# Patient Record
Sex: Male | Born: 1960 | Race: White | Hispanic: No | Marital: Married
Health system: Northeastern US, Academic
[De-identification: ages and names within clinical notes are randomized; demographics above are authoritative.]

---

## 2016-08-17 ENCOUNTER — Emergency Department
Admit: 2016-08-17 | Disposition: A | Discharge status: Home / Self Care | Source: Home / Self Care | Attending: Emergency Medicine | Admitting: Emergency Medicine

## 2016-09-22 ENCOUNTER — Ambulatory Visit (HOSPITAL_BASED_OUTPATIENT_CLINIC_OR_DEPARTMENT_OTHER)

## 2016-09-22 ENCOUNTER — Ambulatory Visit

## 2016-09-22 NOTE — Progress Notes (Addendum)
---    **Progress Notes**    ---    **Patient:** Kenneth Powers   **Provider:** Laverda Page, MD     **DOB:** Feb 05, 1961 **Age:** 55 Y **Sex:** Male   **Date:** 09/22/2016             * * *        ---         **Reason for Appointment**    ---       1\. NP-establish care    ---    2\. Declined flu vaccine    ---       **History of Present Illness**    ---     _Depression Screening_ :    PHQ-9 Little interest or pleasure in doing things Not at all, Feeling down,  depressed, or hopeless Not at all, Trouble falling or staying asleep, or  sleeping too much Not at all, Feeling tired or having little energy Not at  all, Poor appetite or overeating Not at all, Feeling bad about yourself-or  that you are a failure or have let yourself or your family down Not at all,  Trouble concentrating on things, such as reading the newspaper or watching  television Not at all, Moving or speaking so slowly that other people could  have noticed. Or the opposite- being so fidgety or restless that you have been  moving around a lot more than usual Not at all, Thoughts that you would be  better off dead, or of hurting yourself in some way? Not at all, Total Score:  0\.    _*Updated MSSP_ :    Influenza Vaccination (08/01-03/31 current year) Documented No, Patient  Refused. BMI Weight management counseling provided Yes, Recommendations:  Caloric intake discussed. Depression Screening (All Ages) PHQ9 Done: Score <9  (less than or equal to 9).    _General_ :    No complaints. Last PCP >10 yrs ago.       **Current Medications**    ---       None    ---       **Past Medical History**    ---       No Medical History.    ---       **Surgical History**    ---       Denies Past Surgical History    ---       **Family History**    ---       Father: deceased, , family history unknown Unknown    ---    Mother: alive, , family history unknown Unknown    ---    3 brother(s) , 1 sister(s) - healthy. 2 son(s) , 1 daughter(s) - healthy.    ---    - No FHx  MI, CVA, DM, mental illness, substance abuse.    - Father with liver CA at 68YO in no alcohol use & no hepatitis. No other FHx  cancer, including no prostate, testicular or colon CA.    - 4 full siblings.    ---       **Social History**    ---    Tobacco Use Status: current smoker, Patient counseled on cessation:  09/22/2016.   Lives with wife, 8YO dter, 14YO dter, 19YO son and a puppy. Has  3 kids. Feels safe at home.    Tobacco - 4 cigs a day. Began [age]YO. Most 1 ppd x 2-3 yrs.    Alcohol - 2 days on weekends, 2-3 beers.  No drug/marijuana use.    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    ---       **Allergies**    ---       N.K.D.A.    ---       **Hospitalization/Major Diagnostic Procedure**    ---       Denies Past Hospitalization    ---       **Review of Systems**    ---    See scanned intake form.       **Vital Signs**    ---    BP 108/68, HR 68, Ht 71, Wt 121.2, BMI 16.90, RR 16, Oxygen sat % 98.       **Examination**    ---     _General Examination_ :    GENERAL APPEARANCE: Thin, adult male, alert and oriented, no acute distress.Marland Kitchen  HEENT: Normocephalic, atraumatic, B/L Extra Ocular Movements Intact (EOMI),  nonicteric, no scleral erythema, Pupils Equal, Round & Reactive to Light  (PERRL), external ears normal, tympanic membranes normal, nose with normal  external appearance, no maxillary/mastoid or frontal sinus tenderness,  oropharynx/tonsils normal with no erythema, edema or exudate.Marland Kitchen NECK: Supple  without lymphadenopathy. Thyroid - no enlargement, nodules or tenderness.Marland Kitchen  HEART: Regular rate and rhythm, normal S1 & S2, with no murmurs, rubs or  gallops.. LUNGS: Clear to auscultation bilaterally with no wheezes, rhales or  rhonchi.. ABDOMEN: Soft, non-tender, non-distended, BS present.Marland Kitchen EXTREMITIES:  B/L lower extremity with no edema.Marland Kitchen NEUROLOGIC EXAM: Grossly non-focal..  PSYCHOLOGICAL: Affect with no depression, anxiety or agitation..          **Assessments**    ---    1\. Encounter for routine  history and physical examination of adult - V70.0  (Primary)    ---    2\. Osteoporosis - M81.0    ---    3\. Influenza vaccine refused - Z28.21    ---    4\. Encounter for immunization - Z23    ---      \[facility] - Last tetanus > 10 yrs ago; TDaP today. Declines flu vaccine.  Declines COLO; hemosure today. Check total cholesterol & glucose.    - Weight - Most 130-135 lbs over lifetime; 134 when married. Weighs 121 lbs  presently. Not loosing wt rapidly; no recent 10 lbs wt loss. Has always been  very thin; Father and a brother are similar. Discuss wt gain at upcoming  visit.    - Tobacco abuse - 4 cigs daily. see SHx. Trying to quit. Discuss tobacco  cessation and evalutate for low dose CT at upcoming visit.    - Osteoporosis - Does walk regularly. Smokes. Very thin. Caucasian  [place]. Check bone density.    - F/U 2 mos.    ---       **Treatment**    ---       **1\. Encounter for routine history and physical examination of adult**    _LAB: GLUCOSE RANDOM - BS_    _LAB: CHOLESTEROL SERUM - CHOL_    ---        **2\. Osteoporosis**    _Imaging: BD Bone Density DEXA Axial Skeleton e-sch*_         **3\. Others**    Notes: Patient Educated with: Smoking.pdf (Smoking.pdf).      **Immunization**    ---       TDAP - OFFICE SUPPLIED : 0.5 mL given by Sharlot Gowda on Left Arm    ---       **  Procedure Codes**    ---       16109 TDAP - OFFICE SUPPLIED    ---    [phone] Single or Combination    ---       **Follow Up**    ---    2 Months    Electronically signed by Bonne Dolores , M.D. on 09/22/2016 at 10:55 AM EST    Sign off status: Completed          * * *      Patient: Kenneth Powers   Provider: Laverda Page, MD    --- ---    DOB: May 21, 1961  Date: 09/22/2016    Note generated by eClinicalWorks EMR/PM Software ([email or web address])    ---

## 2016-09-25 ENCOUNTER — Ambulatory Visit

## 2017-11-15 ENCOUNTER — Emergency Department
Admit: 2017-11-15 | Disposition: A | Discharge status: Home / Self Care | Source: Home / Self Care | Attending: Family | Admitting: Family

## 2018-05-12 ENCOUNTER — Emergency Department
Admit: 2018-05-12 | Disposition: A | Discharge status: Home / Self Care | Source: Home / Self Care | Attending: Pediatrics | Admitting: Pediatrics

## 2018-05-12 LAB — HX URINE DIPSTICK W/REFLEX
CASE NUMBER: 2019200002360
HX UA BILIRUBIN: NEGATIVE — NL
HX UA GLUCOSE: NEGATIVE — NL
HX UA KETONES: 20 mg/dL — AB
HX UA LEUKOCYTE ESTERASE: NEGATIVE — NL
HX UA NITRITE: NEGATIVE — NL
HX UA PH: 5 — NL (ref 5.0–8.0)
HX UA PROTEIN: NEGATIVE — NL
HX UA RBC: 23 /HPF — ABNORMAL HIGH (ref 0.0–2.0)
HX UA SPECIFIC GRAVITY: 1.024 — NL (ref 1.003–1.03)
HX UA SQUAMOUS EPITHELIAL: <1 — NL (ref 0.0–5.0)
HX UA UROBILINOGEN: NEGATIVE — NL
HX UA WBC: 2 /HPF — NL (ref 0.0–5.0)

## 2018-05-25 ENCOUNTER — Ambulatory Visit: Admitting: Orthopaedic Surgery

## 2018-05-25 NOTE — Consults (Signed)
__________________________________________________________________________    CONSULTATION  DATE:  05/25/2018    REASON FOR CONSULTATION:  Back pain due to compression fracture.    HISTORY OF PRESENT ILLNESS:  The patient is a 57 year old gentleman who  sustained an injury on 05/12/2018 when he was working for a [employer]  when he was struck by  falling debris in an excavation hole.  He was partially  buried for a period of time.  After he was extracted, he complained of severe  back pain.  He was taken to the Emergency Department where L2 compression  fracture was identified.  He was found to have intact extremity functioning and  is here.  He was given pain medication and discharged and is here for an  outpatient consultation.  He reports no history of similar symptoms.  No back  pain and reported he did not note any loss of consciousness during the incident.   He reports over the last two weeks since the injury continued to have back pain  and difficulty ambulating due to his symptoms.  He did not report any bowel or  bladder symptoms.    PAST MEDICAL HISTORY:  Otherwise noncontributory.  He does have a history of  smoking.    PHYSICAL EXAMINATION:  GENERAL:  Demonstrates a well-developed, well-nourished  gentleman who is in back discomfort.  HEAD AND NECK:  Unremarkable.  EXTREMITIES:  Upper extremity exam shows  nontender range of motion.  BACK:  Clearly demonstrates diffuse tenderness  centered around the region of upper lumbar spine.  He was able to maintain  straight leg raise bilaterally distally.  Dorsi plantar flexion also intact.   Sensation grossly intact.    RADIOLOGY DATA:  X-ray and CT scan was reviewed.  It demonstrates acute L2  vertebral body burst fracture with approximately 50% loss of height and no  retropulsion into the canal space.  There is a nondisplaced L2 spinous process  fracture as well.  It does not appear to be renal injuries.    IMPRESSION:  L2 compression fracture and  spinous process fracture.    PLAN:  He will be fitted with a TLSO brace.  He also requires Physical Therapy.   He is not able to return to work at this time.  Will followup to assess his  progress in four weeks.    Dictated by:  Jenne Pane, M.D.    D:  05/26/2018 08:22:55  T:  05/27/2018 11:38:56  E:  05/27/2018 11:38:56  WW/tam  Job# [id number]   SIGNATURE LINE    Electronically signed by Dorna Bloom MD, [facility] on 05/29/2018 at 07:18:55 EST

## 2019-09-19 ENCOUNTER — Emergency Department
Admit: 2019-09-19 | Disposition: A | Discharge status: Home / Self Care | Source: Home / Self Care | Attending: Emergency Medicine | Admitting: Emergency Medicine

## 2019-09-19 LAB — HX BLUE TOP TO HOLD: CASE NUMBER: 2020330002054

## 2019-09-19 LAB — HX .AUTOMATED DIFF
CASE NUMBER: 2020330002054
HX ABSOLUTE BASO COUNT: 0.03 10*3/uL — NL (ref 0.0–0.22)
HX ABSOLUTE EOS COUNT: 0.11 10*3/uL — NL (ref 0.0–0.45)
HX ABSOLUTE LYMPHS COUNT: 1.75 10*3/uL — NL (ref 0.74–5.04)
HX ABSOLUTE MONO COUNT: 0.69 10*3/uL — NL (ref 0.0–1.34)
HX ABSOLUTE NEUTRO COUNT: 10.98 10*3/uL — ABNORMAL HIGH (ref 1.48–7.95)
HX BASOPHILS: 0.2 %
HX EOSINOPHILS: 0.8 %
HX IMMATURE GRANULOCYTES: 0.4 % — NL (ref 0.0–2.0)
HX LYMPHOCYTES: 12.9 %
HX MONOCYTES: 5.1 %
HX NEUTROPHILS: 80.6 %

## 2019-09-19 LAB — HX COMPREHENSIVE METABOLIC PANEL
CASE NUMBER: 2020330002054
HX ALBUMIN LVL: 4 g/dL — NL (ref 3.2–5.0)
HX ALKALINE PHOSPHATASE: 74 U/L — NL (ref 30.0–117.0)
HX ALT: 11 U/L — NL (ref 6.0–55.0)
HX ANION GAP: 6 — NL (ref 3.0–11.0)
HX AST: 11 U/L — NL (ref 6.0–40.0)
HX BILIRUBIN TOTAL: 0.3 mg/dL — NL (ref 0.2–1.2)
HX BUN: 21 mg/dL — ABNORMAL HIGH (ref 6.0–20.0)
HX CALCIUM LVL: 9 mg/dL — NL (ref 8.5–10.5)
HX CHLORIDE: 106 mmol/L — NL (ref 98.0–110.0)
HX CO2: 25 mmol/L — NL (ref 21.0–32.0)
HX CREATININE: 0.994 mg/dL — NL (ref 0.55–1.3)
HX GLUCOSE LVL: 96 mg/dL — NL (ref 70.0–110.0)
HX POTASSIUM LVL: 4.8 mmol/L — NL (ref 3.6–5.2)
HX SODIUM LVL: 137 mmol/L — NL (ref 136.0–146.0)
HX TOTAL PROTEIN: 7.6 g/dL — NL (ref 6.0–8.4)

## 2019-09-19 LAB — HX CBC W/ DIFF
CASE NUMBER: 2020330002054
HX ABSOLUTE NRBC COUNT: 0 10*3/uL
HX HCT: 38.8 % — ABNORMAL LOW (ref 39.0–53.0)
HX HGB: 13.6 g/dL — NL (ref 13.0–17.5)
HX MCH: 34.1 pg — ABNORMAL HIGH (ref 26.0–34.0)
HX MCHC: 35.1 g/dL — NL (ref 31.0–37.0)
HX MCV: 97.2 fL — NL (ref 80.0–100.0)
HX MPV: 10.1 fL — NL (ref 9.4–12.4)
HX NRBC PERCENT: 0 % — NL
HX PLATELET: 200 10*3/uL — NL (ref 150.0–400.0)
HX RBC: 3.99 10*6/uL — ABNORMAL LOW (ref 4.2–5.9)
HX RDW-CV: 11.8 % — NL (ref 11.5–14.5)
HX RDW-SD: 42.5 fL — NL (ref 35.0–51.0)
HX WBC: 13.6 10*3/uL — ABNORMAL HIGH (ref 4.0–11.0)

## 2019-09-19 LAB — HX GLOMERULAR FILTRATION RATE (ESTIMATED)
CASE NUMBER: 2020330002054
HX AFN AMER GLOMERULAR FILTRATION RATE: >90
HX NON-AFN AMER GLOMERULAR FILTRATION RATE: 84 mL/min/{1.73_m2}

## 2019-09-19 LAB — HX SST GOLD TUBE TO HOLD: CASE NUMBER: 2020330002148

## 2019-09-19 LAB — HX TROPONIN I
CASE NUMBER: 2020330002054
HX TROPONIN I: <0.015 — NL (ref 0.015–0.045)

## 2019-09-26 LAB — LIPID PROFILE (EXT)
Chol/HDL Ratio (EXT): 3.7 (ref <=4.9)
Cholesterol (EXT): 179 mg/dL (ref <=199)
HDL Cholesterol (EXT): 49 mg/dL (ref >=41)
LDL Cholesterol, CALC (EXT): 107.2 mg/dL (ref <=130)
Triglycerides (EXT): 114 mg/dL (ref <=149)

## 2019-09-26 LAB — BMP (EXT)
BUN (EXT): 23 mg/dL (ref 7–25)
CO2 (EXT): 25 mmol/L (ref 21–33)
CalciumCalcium (EXT): 9.3 mg/dL (ref 8.8–10.6)
Chloride (EXT): 102 mmol/L (ref 98–110)
Creatinine (EXT): 0.89 mg/dL (ref 0.50–1.40)
Glucose (EXT): 100 mg/dL — ABNORMAL HIGH (ref 60–99)
Potassium (EXT): 4.3 mmol/L (ref 3.3–5.3)
Sodium (EXT): 136 mmol/L (ref 134–146)
eGFR - Creat MDRD (EXT): >60 (ref >60)
eGFR - Creat MDRD (EXT): >60 (ref >60)

## 2019-09-26 LAB — PSA (EXT): PSA (EXT): 0.6 ng/mL (ref 0.0–4.0)

## 2019-09-26 LAB — HEPATITIS C ANTIBODY (EXT): HEPATITIS C ANTIBODY (EXT): 0.03 {index_val} (ref <0.80)

## 2021-01-20 NOTE — Progress Notes (Addendum)
---    **Progress Notes**    ---    **Patient:** Kenneth Powers   **Provider:** Laverda Page, MD     **DOB:** Feb 05, 1961 **Age:** 60 Y **Sex:** Male   **Date:** 09/22/2016             * * *        ---         **Reason for Appointment**    ---       1\. NP-establish care    ---    2\. Declined flu vaccine    ---       **History of Present Illness**    ---     _Depression Screening_ :    PHQ-9 Little interest or pleasure in doing things Not at all, Feeling down,  depressed, or hopeless Not at all, Trouble falling or staying asleep, or  sleeping too much Not at all, Feeling tired or having little energy Not at  all, Poor appetite or overeating Not at all, Feeling bad about yourself-or  that you are a failure or have let yourself or your family down Not at all,  Trouble concentrating on things, such as reading the newspaper or watching  television Not at all, Moving or speaking so slowly that other people could  have noticed. Or the opposite- being so fidgety or restless that you have been  moving around a lot more than usual Not at all, Thoughts that you would be  better off dead, or of hurting yourself in some way? Not at all, Total Score:  0\.    _*Updated MSSP_ :    Influenza Vaccination (08/01-03/31 current year) Documented No, Patient  Refused. BMI Weight management counseling provided Yes, Recommendations:  Caloric intake discussed. Depression Screening (All Ages) PHQ9 Done: Score <9  (less than or equal to 9).    _General_ :    No complaints. Last PCP >10 yrs ago.       **Current Medications**    ---       None    ---       **Past Medical History**    ---       No Medical History.    ---       **Surgical History**    ---       Denies Past Surgical History    ---       **Family History**    ---       Father: deceased, , family history unknown Unknown    ---    Mother: alive, , family history unknown Unknown    ---    3 brother(s) , 1 sister(s) - healthy. 2 son(s) , 1 daughter(s) - healthy.    ---    - No FHx  MI, CVA, DM, mental illness, substance abuse.    - Father with liver CA at 68YO in no alcohol use & no hepatitis. No other FHx  cancer, including no prostate, testicular or colon CA.    - 4 full siblings.    ---       **Social History**    ---    Tobacco Use Status: current smoker, Patient counseled on cessation:  09/22/2016.   Lives with wife, 8YO dter, 14YO dter, 19YO son and a puppy. Has  3 kids. Feels safe at home.    Tobacco - 4 cigs a day. Began [age]YO. Most 1 ppd x 2-3 yrs.    Alcohol - 2 days on weekends, 2-3 beers.  No drug/marijuana use.    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    ---       **Allergies**    ---       N.K.D.A.    ---       **Hospitalization/Major Diagnostic Procedure**    ---       Denies Past Hospitalization    ---       **Review of Systems**    ---    See scanned intake form.       **Vital Signs**    ---    BP 108/68, HR 68, Ht 71, Wt 121.2, BMI 16.90, RR 16, Oxygen sat % 98.       **Examination**    ---     _General Examination_ :    GENERAL APPEARANCE: Thin, adult male, alert and oriented, no acute distress.Marland Kitchen  HEENT: Normocephalic, atraumatic, B/L Extra Ocular Movements Intact (EOMI),  nonicteric, no scleral erythema, Pupils Equal, Round & Reactive to Light  (PERRL), external ears normal, tympanic membranes normal, nose with normal  external appearance, no maxillary/mastoid or frontal sinus tenderness,  oropharynx/tonsils normal with no erythema, edema or exudate.Marland Kitchen NECK: Supple  without lymphadenopathy. Thyroid - no enlargement, nodules or tenderness.Marland Kitchen  HEART: Regular rate and rhythm, normal S1 & S2, with no murmurs, rubs or  gallops.. LUNGS: Clear to auscultation bilaterally with no wheezes, rhales or  rhonchi.. ABDOMEN: Soft, non-tender, non-distended, BS present.Marland Kitchen EXTREMITIES:  B/L lower extremity with no edema.Marland Kitchen NEUROLOGIC EXAM: Grossly non-focal..  PSYCHOLOGICAL: Affect with no depression, anxiety or agitation..          **Assessments**    ---    1\. Encounter for routine  history and physical examination of adult - V70.0  (Primary)    ---    2\. Osteoporosis - M81.0    ---    3\. Influenza vaccine refused - Z28.21    ---    4\. Encounter for immunization - Z23    ---      \[facility] - Last tetanus > 10 yrs ago; TDaP today. Declines flu vaccine.  Declines COLO; hemosure today. Check total cholesterol & glucose.    - Weight - Most 130-135 lbs over lifetime; 134 when married. Weighs 121 lbs  presently. Not loosing wt rapidly; no recent 10 lbs wt loss. Has always been  very thin; Father and a brother are similar. Discuss wt gain at upcoming  visit.    - Tobacco abuse - 4 cigs daily. see SHx. Trying to quit. Discuss tobacco  cessation and evalutate for low dose CT at upcoming visit.    - Osteoporosis - Does walk regularly. Smokes. Very thin. Caucasian  [place]. Check bone density.    - F/U 2 mos.    ---       **Treatment**    ---       **1\. Encounter for routine history and physical examination of adult**    _LAB: GLUCOSE RANDOM - BS_    _LAB: CHOLESTEROL SERUM - CHOL_    ---        **2\. Osteoporosis**    _Imaging: BD Bone Density DEXA Axial Skeleton e-sch*_         **3\. Others**    Notes: Patient Educated with: Smoking.pdf (Smoking.pdf).      **Immunization**    ---       TDAP - OFFICE SUPPLIED : 0.5 mL given by Sharlot Gowda on Left Arm    ---       **  Procedure Codes**    ---       16109 TDAP - OFFICE SUPPLIED    ---    [phone] Single or Combination    ---       **Follow Up**    ---    2 Months    Electronically signed by Bonne Dolores , M.D. on 09/22/2016 at 10:55 AM EST    Sign off status: Completed          * * *      Patient: Kenneth Powers   Provider: Laverda Page, MD    --- ---    DOB: [birth year]? **Age:** 60  Date: 09/22/2016    Note generated by eClinicalWorks EMR/PM Software ([email or web address])    ---

## 2021-09-06 ENCOUNTER — Other Ambulatory Visit

## 2021-09-06 ENCOUNTER — Encounter

## 2021-09-06 ENCOUNTER — Inpatient Hospital Stay
Admit: 2021-09-06 | Discharge: 2021-09-07 | Disposition: A | Discharge status: Home / Self Care | Payer: MEDICARE | Attending: Emergency Medicine

## 2021-09-06 ENCOUNTER — Encounter (HOSPITAL_BASED_OUTPATIENT_CLINIC_OR_DEPARTMENT_OTHER)

## 2021-09-06 DIAGNOSIS — R0981 Nasal congestion: Secondary | ICD-10-CM

## 2021-09-06 MED ORDER — fluticasone (Flonase) 50 mcg/actuation nasal spray
50 | Freq: Two times a day (BID) | NASAL | 0 refills | Status: AC
Start: 2021-09-06 — End: 2021-10-06

## 2021-09-06 MED ORDER — cetirizine (ZyrTEC) 10 mg tablet
10 | ORAL_TABLET | Freq: Every day | ORAL | 0 refills | 90.00000 days | Status: AC
Start: 2021-09-06 — End: ?

## 2021-09-06 NOTE — ED Triage Notes (Signed)
 2 days of feeling like something in throat and a very dry throat drinking water with no problem

## 2021-09-06 NOTE — ED Provider Notes (Signed)
 HPI   Chief Complaint   Patient presents with   ? Shortness of Breath       60 year old male with a history of previous nasal allergies previous use Flonase and Zyrtec but states he is not on these currently.  Also current tobacco user.  Patient reports yesterday began to develop left-sided nasal congestion which he states is a feeling of fullness does report that he was able to swallow some phlegm but that he is not able to blow his nose and produce much drainage.  He reports no epistaxis.  He reports no headache.  He also reports that his mouth feels dry.  He denies a sore throat.  He states he is able to drink water without difficulty and was able to drink a full bottle of water while waiting in the ER.  He does report that he feels there is phlegm and congestion in his throat and he states that when he tries to eat solid food he begins to cough it up.  He states that his symptoms started yesterday.  He reports no fevers, no difficulty breathing with exception of as related to his nasal congestion (no trouble when breathing through mouth).       History provided by:  Patient and medical records  Language interpreter used: No                  No data recorded                                Patient History   Past Medical History:   Diagnosis Date   ? Known health problems: none      Past Surgical History:   Procedure Laterality Date   ? NO PAST SURGERIES       No family history on file.  Social History     Tobacco Use   ? Smoking status: Not on file   ? Smokeless tobacco: Not on file   Substance Use Topics   ? Alcohol use: Not on file   ? Drug use: Not on file       Review of Systems   Review of Systems   Constitutional: Negative for chills and fever.   HENT: Positive for congestion, postnasal drip, sinus pressure, sinus pain and trouble swallowing (states phlegm in throat makes taking solids difficult). Negative for ear pain, facial swelling, mouth sores, nosebleeds, sneezing, sore throat and voice change.     Eyes: Negative for pain and visual disturbance.   Respiratory: Negative for cough, choking, shortness of breath, wheezing and stridor.    Cardiovascular: Negative for chest pain and palpitations.   Gastrointestinal: Negative for abdominal pain and vomiting.   Genitourinary: Negative for dysuria and hematuria.   Musculoskeletal: Negative for arthralgias and back pain.   Skin: Negative for color change and rash.   Neurological: Negative for seizures, syncope and headaches.   All other systems reviewed and are negative.      Physical Exam   ED Triage Vitals [09/06/21 1815]   Temp Pulse Resp BP   36.8 ?C (98.2 ?F) 56 16 (!) 152/87      SpO2 Temp Source Heart Rate Source Patient Position   99 % Oral Radial Sitting      BP Location FiO2 (%)     Left arm --       Physical Exam  Vitals and nursing note reviewed.   Constitutional:  General: He is not in acute distress.     Appearance: He is well-developed.      Comments: Speaking full sentences with no difficulty, no distress.  Drinking water without difficulty.   HENT:      Head: Normocephalic and atraumatic.      Mouth/Throat:      Mouth: Mucous membranes are moist.      Pharynx: Oropharynx is clear. No pharyngeal swelling or oropharyngeal exudate.      Comments: No thrush, oropharynx widely patent  Eyes:      Extraocular Movements: Extraocular movements intact.      Conjunctiva/sclera: Conjunctivae normal.      Pupils: Pupils are equal, round, and reactive to light.   Cardiovascular:      Rate and Rhythm: Normal rate and regular rhythm.   Pulmonary:      Effort: Pulmonary effort is normal. No respiratory distress.      Breath sounds: Normal breath sounds.   Abdominal:      Palpations: Abdomen is soft.      Tenderness: There is no abdominal tenderness.   Musculoskeletal:         General: No swelling. Normal range of motion.      Cervical back: Normal range of motion and neck supple.   Skin:     General: Skin is warm and dry.      Capillary Refill: Capillary refill  takes less than 2 seconds.   Neurological:      General: No focal deficit present.      Mental Status: He is alert and oriented to person, place, and time.   Psychiatric:         Mood and Affect: Mood normal.       No orders to display       Labs Reviewed - No data to display    ED Course & MDM   Diagnoses as of 09/06/21 2308   Nasal congestion   Dry throat   Hypertension, unspecified type       MDM  Number of Diagnoses or Management Options  Dry throat  Hypertension, unspecified type  Nasal congestion  Diagnosis management comments: 1. Nasal congestion -patient will be given prescription for Flonase and Zyrtec as he has been on these previously and states they were helpful.    2. Globus sensation -patient reports feeling of phlegm in his throat related to his nasal congestion and postnasal drip.  We feel decongestants may be helpful.  He was able to drink liquids and is having no difficulty swallowing or breathing at this time.    3.  Hypertension-patient noted to be hypertensive initially on assessment today.  His blood pressure did improve significantly at time of discharge without intervention in the ED.  Patient was advised that he does need to follow-up with his primary care physician for repeat check of his blood pressure to ensure no chronic hypertension issues and was also advised to avoid or cough medicines as these and decongestants may exacerbate his blood pressure.  No symptoms suggestive of hypertensive emergency.  Discharged in stable condition.         Talitha Givens, MD  09/29/21 2128

## 2021-09-06 NOTE — Other (Signed)
 Patient Education   Table of Contents       Postnasal Drip     To view videos and all your education online visit,   [email or web address]   or scan this QR code with your smartphone.                    Postnasal Drip     Postnasal drip is the feeling of mucus going down the back of your throat. Mucus is a slimy substance that moistens and cleans your nose and throat, as well as the air pockets in face bones near your forehead and cheeks (sinuses). Small amounts of mucus pass from your nose and sinuses down the back of your throat all the time. This is normal. When you produce too much mucus or the mucus gets too thick, you can feel it.    Some common causes of postnasal drip include:      Having more mucus because of:       A cold or the flu.       Allergies.       Cold air.       Certain medicines.      Having more mucus that is thicker because of:       A sinus or nasal infection.       Dry air.       A food allergy.     Follow these instructions at home:   Relieving discomfort            Gargle with a salt-water mixture 3?4 times a day or as needed. To make a salt-water mixture, completely dissolve ??1 tsp of salt in 1 cup of warm water.       If the air in your home is dry, use a humidifier to add moisture to the air.       Use a saline spray or container (neti pot) to flush out the nose (nasal irrigation). These methods can help clear away mucus and keep the nasal passages moist.     General instructions         Take over-the-counter and prescription medicines only as told by your health care provider.       Follow instructions from your health care provider about eating or drinking restrictions. You may need to avoid caffeine.       Avoid things that you know you are allergic to (allergens), like dust, mold, pollen, pets, or certain foods.       Drink enough fluid to keep your urine pale yellow.       Keep all follow-up visits as told by your health care provider. This is important.       Contact a  health care provider if:         You have a fever.       You have a sore throat.       You have difficulty swallowing.       You have headache.       You have sinus pain.       You have a cough that does not go away.       The mucus from your nose becomes thick and is green or yellow in color.       You have cold or flu symptoms that last more than 10 days.     Summary         Postnasal drip is the feeling of mucus going  down the back of your throat.       If your health care provider approves, use nasal irrigation or a nasal spray 2?4 times a day.       Avoid things that you know you are allergic to (allergens), like dust, mold, pollen, pets, or certain foods.     This information is not intended to replace advice given to you by your health care provider. Make sure you discuss any questions you have with your health care provider.     Document Released: 04/02/2018Document Revised: 09/28/2021Document Reviewed: 07/22/2020     Elsevier Patient Education ? 2022 [employer]

## 2021-09-06 NOTE — ED Notes (Signed)
 Pt A AOX 3 and reports that his nose has been stuffy x 2 days,  Pt updated to plan of care. No signs and symptoms of distress noted.      Audree Bane, RN  09/06/21 [phone]

## 2021-09-06 NOTE — Other (Signed)
 Patient Education   Table of Contents       Hypertension, Adult     To view videos and all your education online visit,   [email or web address]   or scan this QR code with your smartphone.                    Hypertension, Adult     Hypertension is another name for high blood pressure. High blood pressure forces your heart to work harder to pump blood. This can cause problems over time.   There are two numbers in a blood pressure reading. There is a top number (systolic) over a bottom number (diastolic). It is best to have a blood pressure that is below 120/80. Healthy choices can help lower your blood pressure, or you may need medicine to help lower it.   What are the causes?   The cause of this condition is not known. Some conditions may be related to high blood pressure.   What increases the risk?         Smoking.       Having type 2 diabetes mellitus, high cholesterol, or both.       Not getting enough exercise or physical activity.       Being overweight.       Having too much fat, sugar, calories, or salt (sodium) in your diet.       Drinking too much alcohol.       Having long-term (chronic) kidney disease.       Having a family history of high blood pressure.       Age. Risk increases with age.       Race. You may be at higher risk if you are African American.       Gender. Men are at higher risk than women before age 41. After age 35, women are at higher risk than men.       Having obstructive sleep apnea.       Stress.     What are the signs or symptoms?        High blood pressure may not cause symptoms. Very high blood pressure (hypertensive crisis) may cause:       Headache.       Feelings of worry or nervousness (anxiety).       Shortness of breath.       Nosebleed.       A feeling of being sick to your stomach (nausea).       Throwing up (vomiting).       Changes in how you see.       Very bad chest pain.       Seizures.     How is this treated?        This condition is treated by making  healthy lifestyle changes, such as:       Eating healthy foods.       Exercising more.       Drinking less alcohol.      Your health care provider may prescribe medicine if lifestyle changes are not enough to get your blood pressure under control, and if:       Your top number is above 130.       Your bottom number is above 80.       Your personal target blood pressure may vary.     Follow these instructions at home:   Eating and drinking  If told, follow the DASH eating plan. To follow this plan:       Fill one half of your plate at each meal with fruits and vegetables.       Fill one fourth of your plate at each meal with whole grains. Whole grains include whole-wheat pasta, brown rice, and whole-grain bread.       Eat or drink low-fat dairy products, such as skim milk or low-fat yogurt.       Fill one fourth of your plate at each meal with low-fat (lean) proteins. Low-fat proteins include fish, chicken without skin, eggs, beans, and tofu.       Avoid fatty meat, cured and processed meat, or chicken with skin.       Avoid pre-made or processed food.       Eat less than 1,500 mg of salt each day.      Do not  drink alcohol if:       Your doctor tells you not to drink.       You are pregnant, may be pregnant, or are planning to become pregnant.      If you drink alcohol:      Limit how much you use to:       0?1 drink a day for women.       0?2 drinks a day for men.       Be aware of how much alcohol is in your drink. In the [place] one drink equals one 12 oz bottle of beer (355 mL), one 5 oz glass of wine (148 mL), or one 1? oz glass of hard liquor (44 mL).     Lifestyle            Work with your doctor to stay at a healthy weight or to lose weight. Ask your doctor what the best weight is for you.       Get at least 30 minutes of exercise most days of the week. This may include walking, swimming, or biking.       Get at least 30 minutes of exercise that strengthens your muscles (resistance exercise) at least  3 days a week. This may include lifting weights or doing Pilates.      Do not  use any products that contain nicotine or tobacco, such as cigarettes, e-cigarettes, and chewing tobacco. If you need help quitting, ask your doctor.       Check your blood pressure at home as told by your doctor.       Keep all follow-up visits as told by your doctor. This is important.       Medicines         Take over-the-counter and prescription medicines only as told by your doctor. Follow directions carefully.      Do not  skip doses of blood pressure medicine. The medicine does not work as well if you skip doses. Skipping doses also puts you at risk for problems.       Ask your doctor about side effects or reactions to medicines that you should watch for.       Contact a doctor if you:         Think you are having a reaction to the medicine you are taking.       Have headaches that keep coming back (recurring).       Feel dizzy.       Have swelling in your ankles.  Have trouble with your vision.     Get help right away if you:         Get a very bad headache.       Start to feel mixed up (confused).       Feel weak or numb.       Feel faint.      Have very bad pain in your:       Chest.       Belly (abdomen).       Throw up more than once.       Have trouble breathing.     Summary         Hypertension is another name for high blood pressure.       High blood pressure forces your heart to work harder to pump blood.       For most people, a normal blood pressure is less than 120/80.       Making healthy choices can help lower blood pressure. If your blood pressure does not get lower with healthy choices, you may need to take medicine.     This information is not intended to replace advice given to you by your health care provider. Make sure you discuss any questions you have with your health care provider.     Document Released: 06/05/2009Document Revised: 08/28/2019Document Reviewed: 06/21/2018     Elsevier Patient Education ?  2022 [employer]

## 2024-03-03 IMAGING — MR COLUNAS cn^COLUNA LOMBAR
4 of 6 series · 34 of 48 positions shown · non-contrast
Comparison: none

[Series 2: T2 · sagittal · 4.0mm · 1.12mm/px · 8 of 14 slices shown (1 of 3)]
[im 1/14]
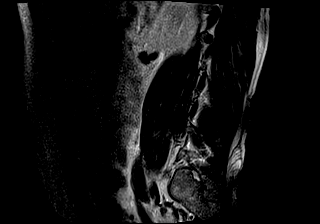
[im 2/14]
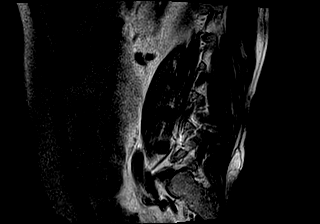
[im 4/14]
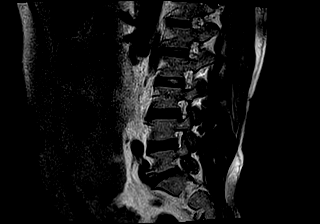
[im 6/14]
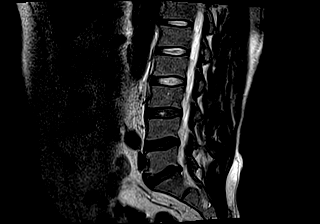
[im 8/14]
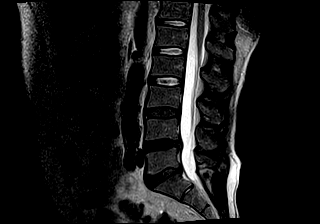
[im 10/14]
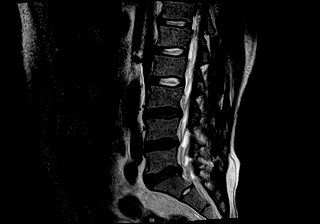
[im 12/14]
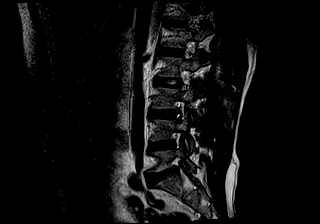
[im 14/14]
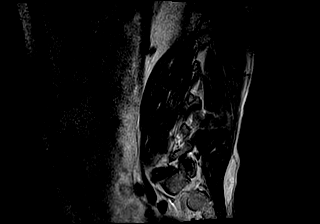

[Series 3: T1 · sagittal · 4.0mm · 1.12mm/px · 6 of 14 slices shown]
[im 1/14]
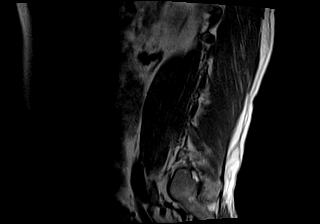
[im 2/14]
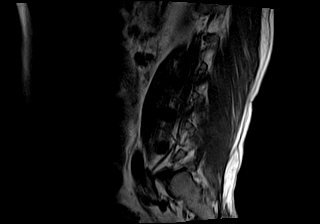
[im 4/14]
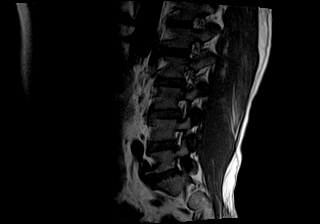
[im 6/14]
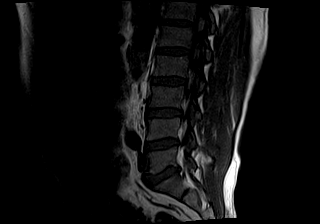
[im 8/14]
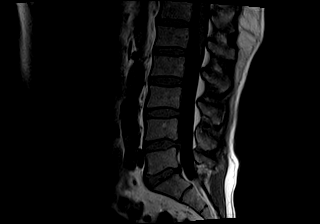
[im 12/14]
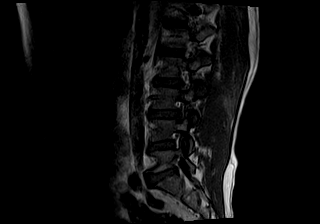

[Series 5: T2 · axial · 4.0mm · 0.55mm/px · z∈[-136,+83]mm · 11 of 20 slices shown (2 of 3)]
[im 1/20]
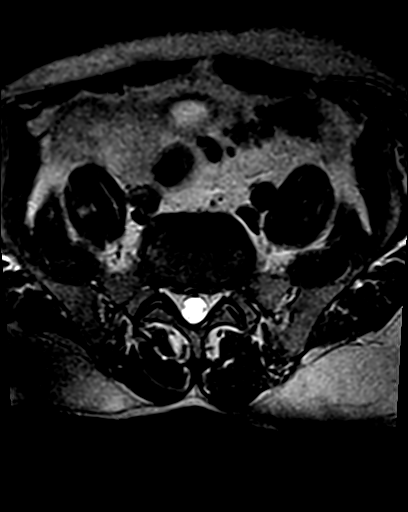
[im 2/20]
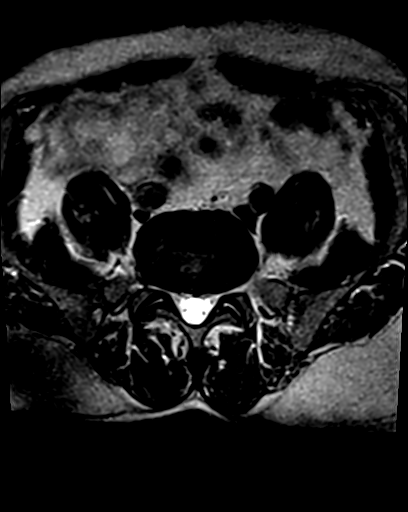
[im 4/20]
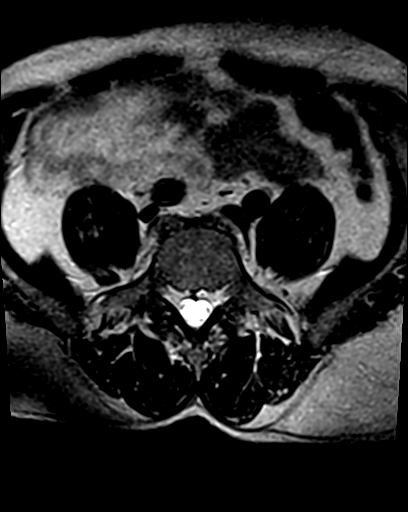
[im 6/20]
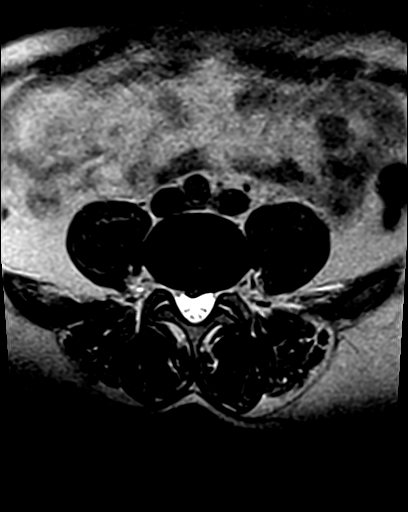
[im 8/20]
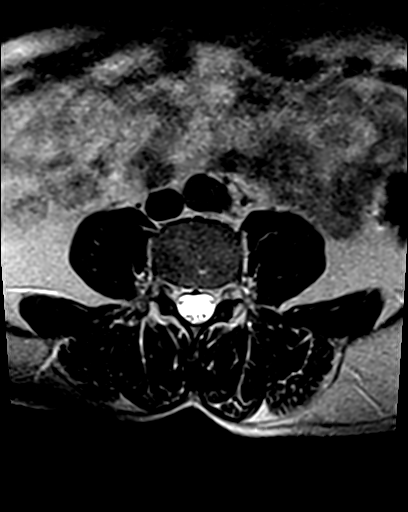
[im 10/20]
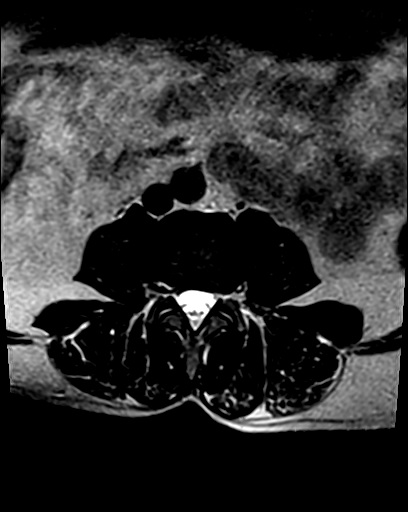
[im 12/20]
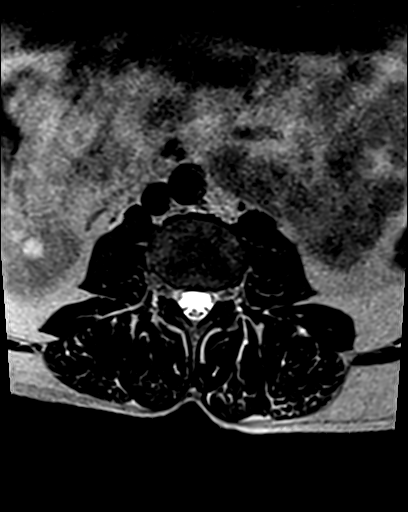
[im 14/20]
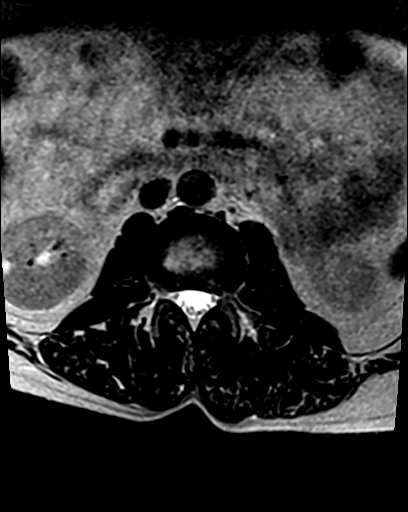
[im 16/20]
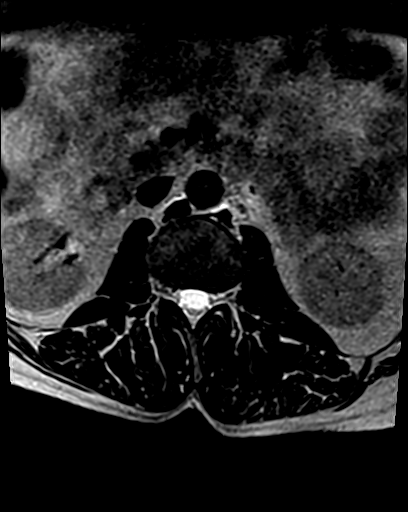
[im 18/20]
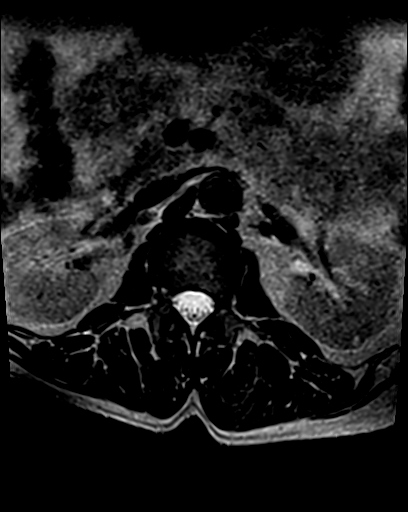
[im 20/20]
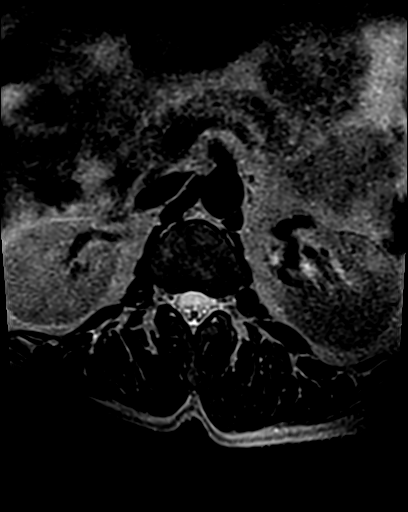

[Series 6: T2 · coronal · 4.0mm · 0.66mm/px · 9 of 16 slices shown (3 of 3)]
[im 1/16]
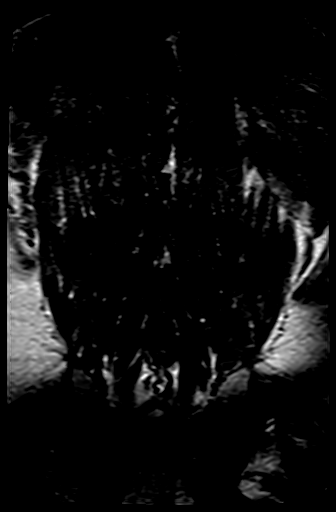
[im 2/16]
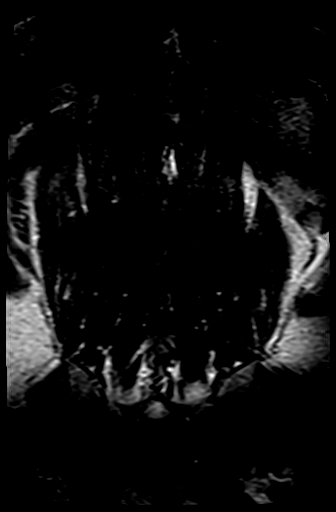
[im 4/16]
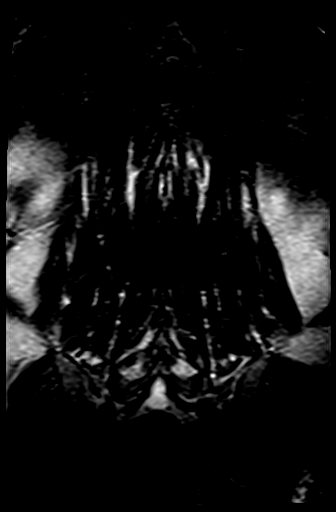
[im 6/16]
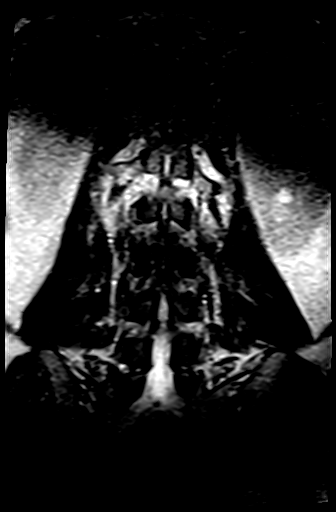
[im 8/16]
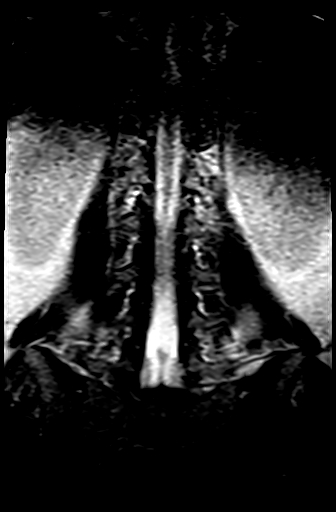
[im 10/16]
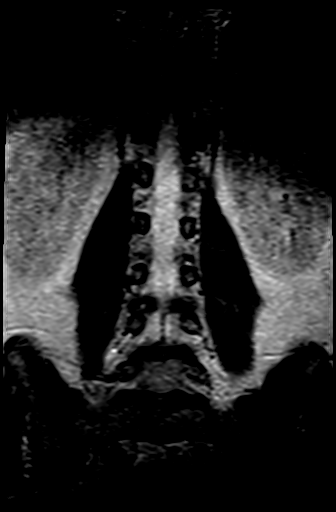
[im 12/16]
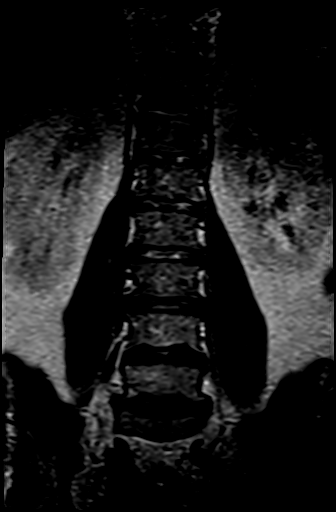
[im 14/16]
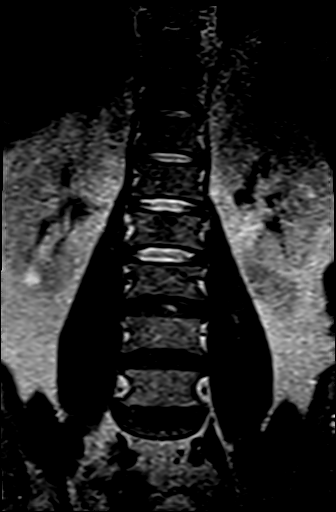
[im 16/16]
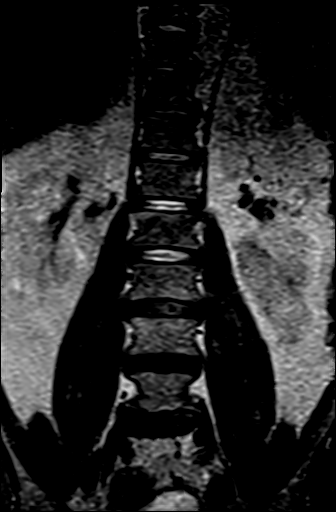

[34 of 48 positions shown; findings below may reference images not displayed]

RESSONÂNCIA MAGNÉTICA DE COLUNA LOMBAR

Indicação clínica:
Lombalgia refratária.

Técnica:
Exame realizado em equipamento de ressonância magnética com sequências, ponderações e planos específicos para o segmento de interesse, sem a administração endovenosa do meio de contraste.

Relatório:
Alinhamento sagital posterior preservado.
Acentuação da lordose fisiológica lombar.
Corpos vertebrais com alturas preservadas, sem fraturas ósseas desalinhadas e com osteófitos somatomarginais de aspecto degenerativo.
Desidratação discal caracterizada por redução do sinal discal nas sequências T2 e STIR com redução do espaço intervertebral em L3-L4 a L5-S1.
Protrusão discal foraminal à direita com fissura do ânulo fibroso em L3-L4 que reduz a amplitude e comprime a porção intracanal da raiz descendente de L4.
Abaulamento discal assimétrico que comprime a face ventral do saco dural, reduz a amplitude foraminal à esquerda em L4-L5. Compressão da porção intracanal da raiz descendente esquerda de L5. Componente protruso posteromediano com fissura do ânulo fibroso associada a discopatia descrita.
Abaulamento discal assimétrico que comprime a face ventral do saco dural, reduz a amplitude foraminal à direita em L5-S1. Compressão da porção intracanal da raiz descendente direita de S1.
Edema no ligamento interespinhoso inferindo provável sobrecarga mecânica em L3-L4, L4-L5 e L5-S1.
Alterações degenerativas nas articulações interapofisárias inferiores.
Lipossubstituição parcial dos ventres musculares lombosacrais.
Alterações degenerativas nas articulações sacroilíacas.
Cone medular é tópico e tem aspecto anatômico.
Canal vertebral com amplitude habitual.

Impressão:
Discopatia lombar.
Edema no ligamento interespinhoso inferindo provável sobrecarga mecânica.
Alterações pormenorizadas no corpo do laudo.

## 2024-03-03 IMAGING — MR MUSCULO cn^QUADRIL
5 series · 37 of 48 positions shown · non-contrast
Comparison: none

[Series 2: T2 · coronal · 4.0mm · 0.78mm/px · 7 of 16 slices shown (1 of 2)]
[im 1/16]
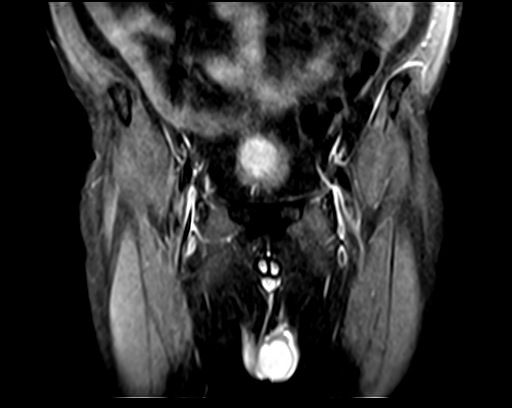
[im 3/16]
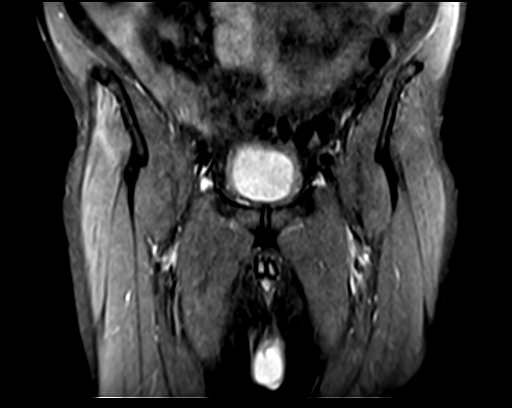
[im 6/16]
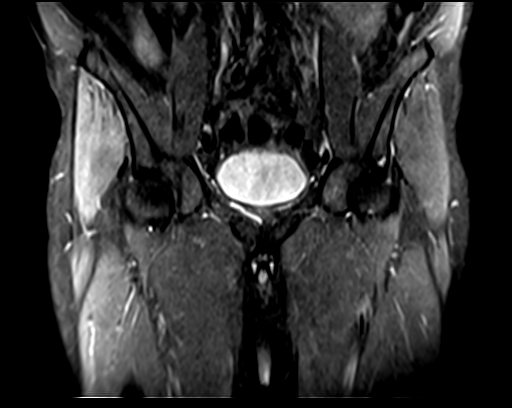
[im 8/16]
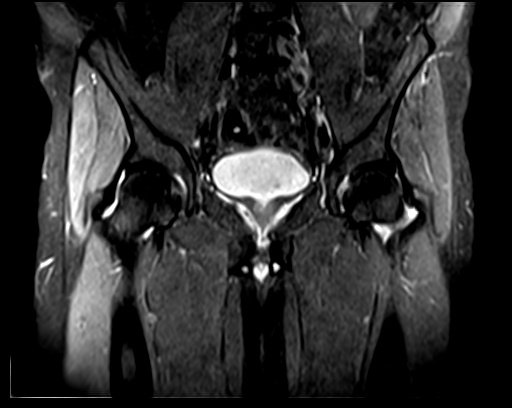
[im 11/16]
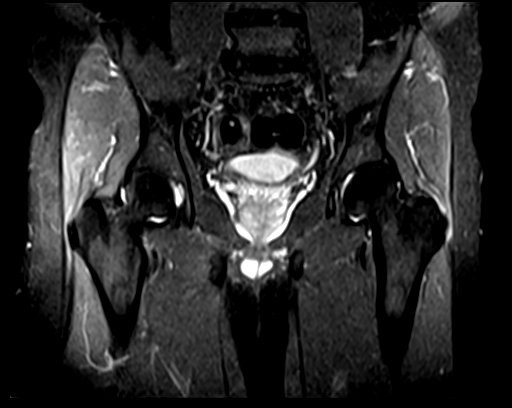
[im 13/16]
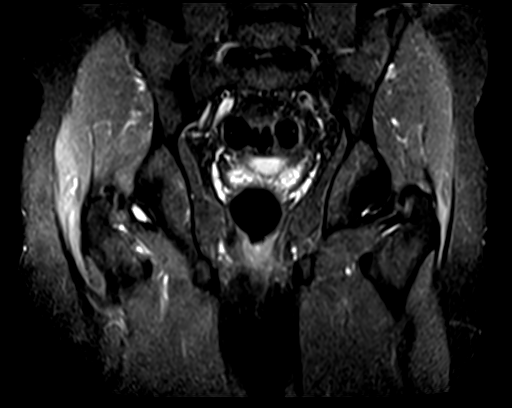
[im 16/16]
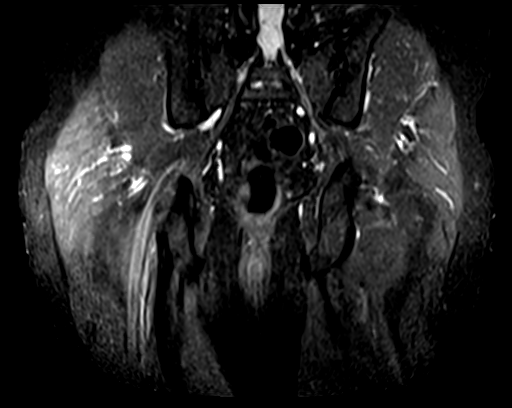

[Series 3: cor t1_(person_name) · coronal · 4.0mm · 1.25mm/px · 7 of 16 slices shown]
[im 1/16]
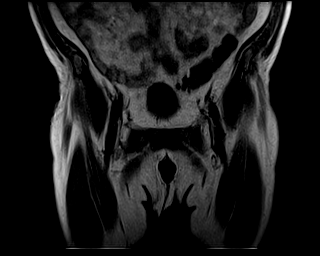
[im 3/16]
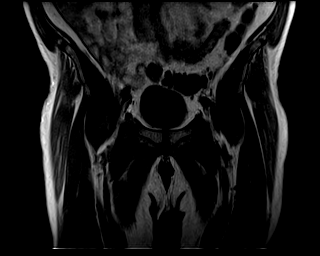
[im 6/16]
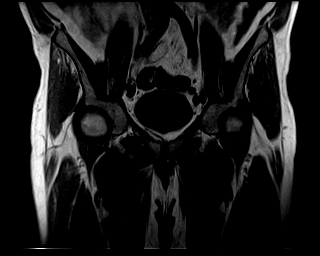
[im 8/16]
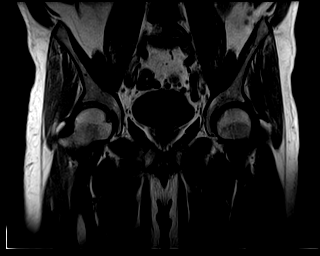
[im 11/16]
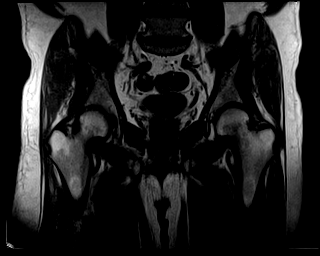
[im 13/16]
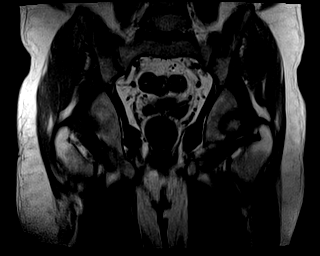
[im 16/16]
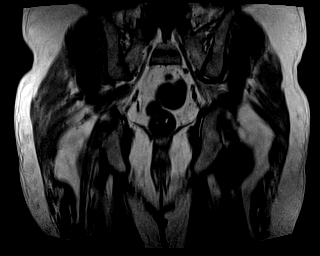

[Series 4: T2 · axial · 4.0mm · 0.78mm/px · z∈[-118,+2]mm · 9 of 24 slices shown (2 of 2)]
[im 1/24]
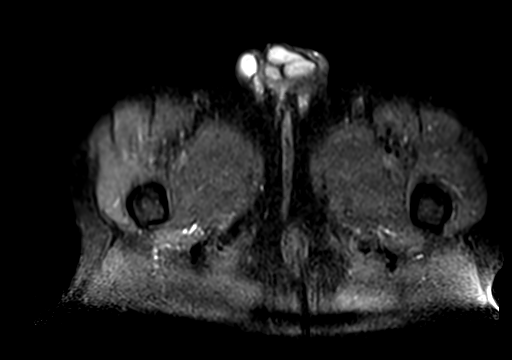
[im 5/24]
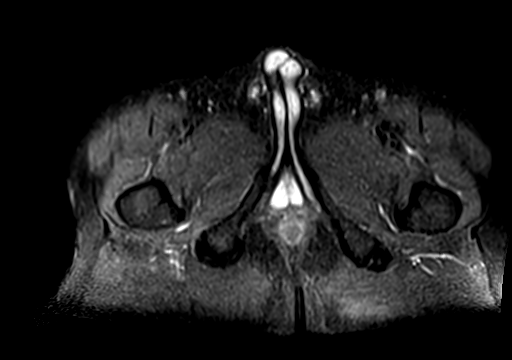
[im 7/24]
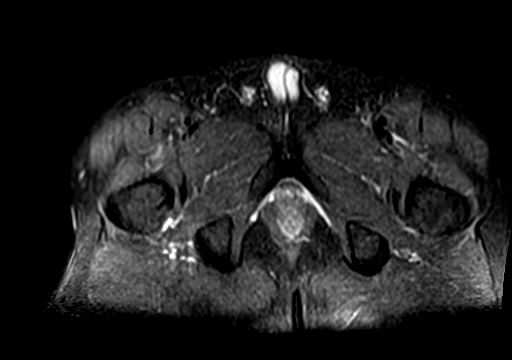
[im 11/24]
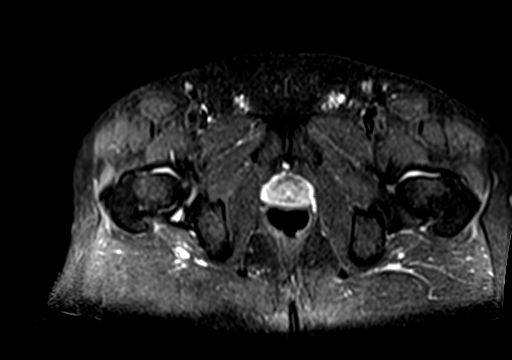
[im 13/24]
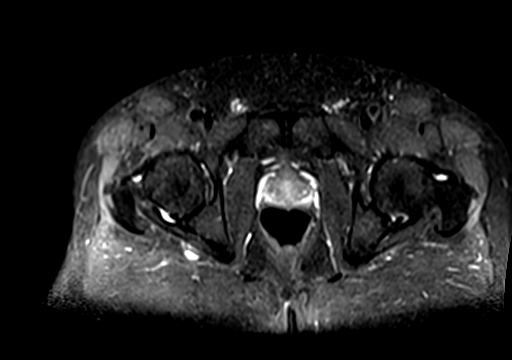
[im 17/24]
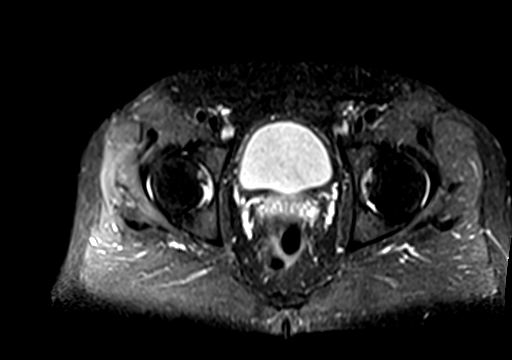
[im 19/24]
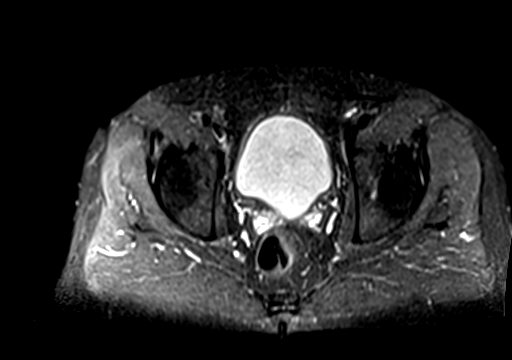
[im 21/24]
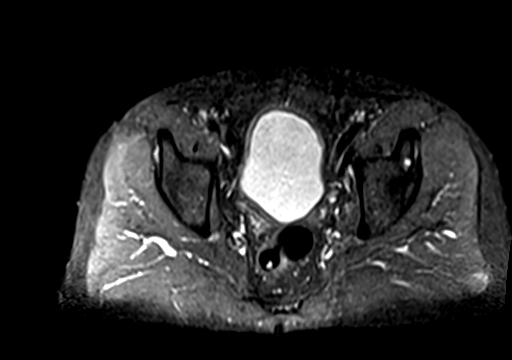
[im 24/24]
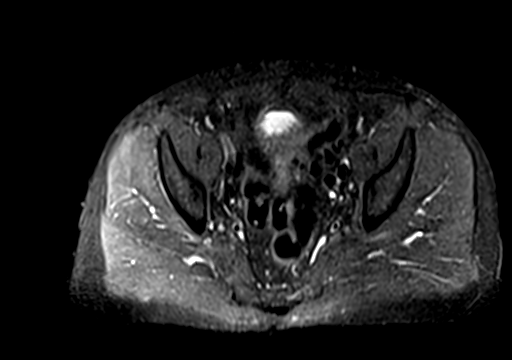

[Series 5: axial t1_(person_name) · axial · 4.0mm · 1.25mm/px · z∈[-118,+2]mm · 9 of 24 slices shown]
[im 1/24]
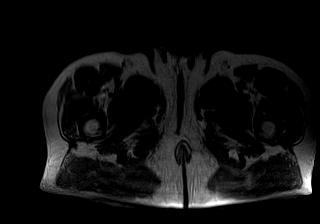
[im 5/24]
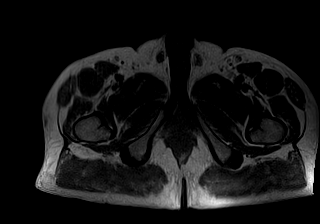
[im 7/24]
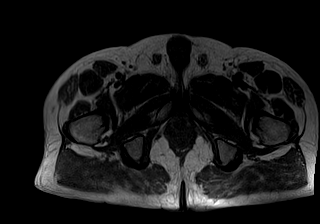
[im 11/24]
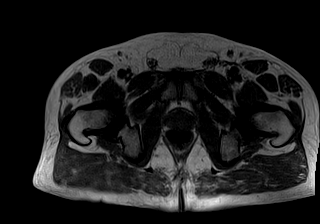
[im 13/24]
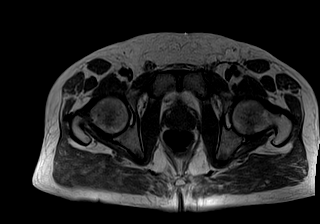
[im 17/24]
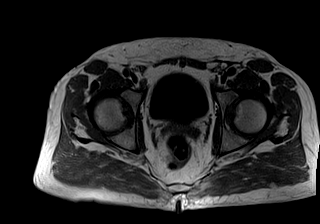
[im 19/24]
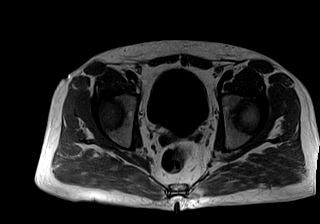
[im 21/24]
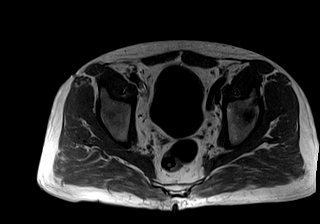
[im 24/24]
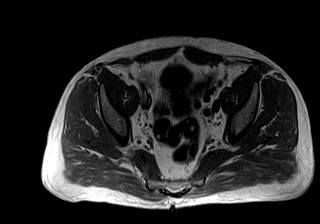

[Series 6: sag dp_(person_name) · sagittal · 4.0mm · 1.25mm/px · 5 of 20 slices shown]
[im 1/20]
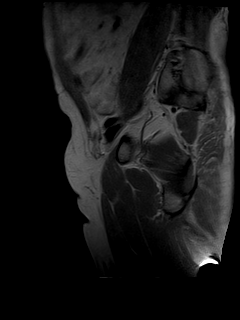
[im 3/20]
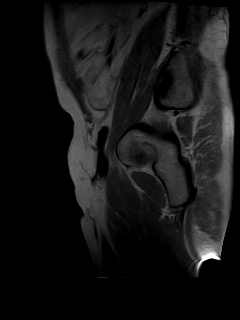
[im 7/20]
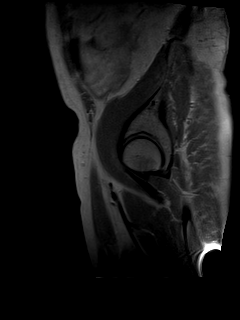
[im 9/20]
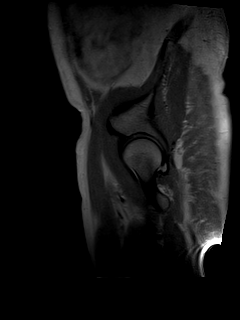
[im 11/20]
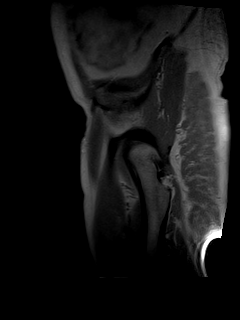

[37 of 48 positions shown; findings below may reference images not displayed]

RESSONÂNCIA MAGNÉTICA DO QUADRIL DIREITO
INDICAÇÃO CLÍNICA
Dor há cerca de três meses em região lombar e quadril direito, com irradiação para o membro inferior direito, associada a formigamento e dificuldade para deambular.
TÉCNICA
Exame realizado em aparelho de ressonância magnética com aquisição de imagens multiplanares e em diferentes ponderações (T1, T2 e STIR), sem administração de contraste intravenoso, com foco no quadril direito.
RESULTADO
Avaliação óssea e articular
Articulação coxofemoral direita congruente, com contornos articulares preservados.
Observa-se discreta redução da espessura condral no aspecto póstero-superior da articulação coxofemoral, com afilamento cartilaginoso bilateral, mais evidente à direita, achado compatível com alterações degenerativas relacionadas à idade.
Demais estruturas ósseas com morfologia e sinal medular preservados, sem sinais de fraturas, lesões infiltrativas ou edema ósseo.
Avaliação ligamentar e do lábio acetabular
Ligamento redondo íntegro.
Lábio acetabular com contornos regulares e sem sinais de rotura ou deslocamento.
Avaliação tendinosa e muscular
Tendões glúteos mínimo e médio, reto femoral, iliopsoas e isquiotibiais com morfologia, espessura e sinal preservados.
Ventres musculares da cintura pélvica com trofismo habitual, sem áreas de lesão ou edema muscular.
Avaliação das bursas e nervos
Bursa trocantérica sem distensão ou sinais de bursite.
Trajeto proximal do nervo ciático sem anormalidades.
CONCLUSÃO
Sinais de afilamento condral bilateral da articulação coxofemoral, mais evidente à direita, compatíveis com alterações degenerativas leves.
Não há evidência de alterações articulares, tendíneas ou neuromusculares que justifiquem o quadro álgico e de irradiação apresentado.
Sugere-se investigação complementar com ressonância magnética da coluna lombar para melhor correlação com a sintomatologia referida.
